# Patient Record
Sex: Female | Born: 1970 | Race: White | Hispanic: No | Marital: Married | Smoking: Never smoker
Health system: Southern US, Community
[De-identification: ages and names within clinical notes are randomized; demographics above are authoritative.]

## PROBLEM LIST (undated history)

## (undated) DIAGNOSIS — I10 Essential (primary) hypertension: Secondary | ICD-10-CM

## (undated) DIAGNOSIS — E785 Hyperlipidemia, unspecified: Secondary | ICD-10-CM

## (undated) DIAGNOSIS — K219 Gastro-esophageal reflux disease without esophagitis: Secondary | ICD-10-CM

## (undated) DIAGNOSIS — J4520 Mild intermittent asthma, uncomplicated: Secondary | ICD-10-CM

## (undated) DIAGNOSIS — R079 Chest pain, unspecified: Secondary | ICD-10-CM

## (undated) DIAGNOSIS — R911 Solitary pulmonary nodule: Secondary | ICD-10-CM

## (undated) HISTORY — DX: Essential (primary) hypertension: I10

## (undated) HISTORY — DX: Hyperlipidemia, unspecified: E78.5

## (undated) HISTORY — DX: Chest pain, unspecified: R07.9

## (undated) HISTORY — DX: Solitary pulmonary nodule: R91.1

## (undated) HISTORY — DX: Gastro-esophageal reflux disease without esophagitis: K21.9

## (undated) HISTORY — PX: OTHER SURGICAL HISTORY: SHX169

## (undated) HISTORY — DX: Mild intermittent asthma, uncomplicated: J45.20

---

## 1998-09-15 HISTORY — PX: TONSILLECTOMY AND ADENOIDECTOMY: SUR1326

## 1998-09-15 HISTORY — PX: TUBAL LIGATION: SHX77

## 2003-09-16 HISTORY — PX: OTHER SURGICAL HISTORY: SHX169

## 2003-10-26 ENCOUNTER — Ambulatory Visit (HOSPITAL_COMMUNITY): Admission: RE | Admit: 2003-10-26 | Discharge: 2003-10-26 | Payer: Self-pay | Admitting: Internal Medicine

## 2004-04-12 ENCOUNTER — Encounter: Admission: RE | Admit: 2004-04-12 | Discharge: 2004-04-12 | Payer: Self-pay | Admitting: Internal Medicine

## 2008-01-05 ENCOUNTER — Encounter: Payer: Self-pay | Admitting: Cardiology

## 2008-02-22 ENCOUNTER — Encounter: Payer: Self-pay | Admitting: Cardiology

## 2008-02-23 ENCOUNTER — Encounter: Payer: Self-pay | Admitting: Cardiology

## 2008-04-20 ENCOUNTER — Encounter: Payer: Self-pay | Admitting: Cardiology

## 2008-05-17 ENCOUNTER — Encounter: Payer: Self-pay | Admitting: Cardiology

## 2008-07-03 ENCOUNTER — Ambulatory Visit: Payer: Self-pay | Admitting: Cardiology

## 2008-07-07 ENCOUNTER — Ambulatory Visit: Payer: Self-pay | Admitting: Cardiology

## 2008-08-29 DIAGNOSIS — R079 Chest pain, unspecified: Secondary | ICD-10-CM | POA: Insufficient documentation

## 2008-08-31 ENCOUNTER — Ambulatory Visit: Payer: Self-pay | Admitting: Cardiology

## 2009-05-09 ENCOUNTER — Encounter (INDEPENDENT_AMBULATORY_CARE_PROVIDER_SITE_OTHER): Payer: Self-pay | Admitting: *Deleted

## 2010-01-19 ENCOUNTER — Encounter: Payer: Self-pay | Admitting: Pulmonary Disease

## 2010-02-05 ENCOUNTER — Ambulatory Visit: Payer: Self-pay | Admitting: Pulmonary Disease

## 2010-02-05 DIAGNOSIS — K219 Gastro-esophageal reflux disease without esophagitis: Secondary | ICD-10-CM

## 2010-02-05 DIAGNOSIS — E785 Hyperlipidemia, unspecified: Secondary | ICD-10-CM

## 2010-02-05 DIAGNOSIS — J984 Other disorders of lung: Secondary | ICD-10-CM

## 2010-02-05 DIAGNOSIS — J45909 Unspecified asthma, uncomplicated: Secondary | ICD-10-CM | POA: Insufficient documentation

## 2010-02-06 ENCOUNTER — Ambulatory Visit: Payer: Self-pay | Admitting: Internal Medicine

## 2010-02-07 ENCOUNTER — Telehealth: Payer: Self-pay | Admitting: Pulmonary Disease

## 2010-08-12 ENCOUNTER — Ambulatory Visit: Payer: Self-pay | Admitting: Cardiology

## 2010-08-12 ENCOUNTER — Ambulatory Visit: Payer: Self-pay | Admitting: Pulmonary Disease

## 2010-10-15 NOTE — Progress Notes (Signed)
Summary: results  Phone Note Call from Patient Call back at [phone]   Caller: Patient Call For: Taegan Haider Summary of Call: calling for ct scan results Initial call taken by: Rickard Patience,  Feb 07, 2010 8:19 AM  Follow-up for Phone Call        please advise of CT results. Thanks. Carron Curie CMA  Feb 07, 2010 9:03 AM   Pt anxious re: CT results.  Can you let her know something today.? Follow-up by: Eugene Gavia,  Feb 08, 2010 9:20 AM  Additional Follow-up for Phone Call Additional follow up Details #1::        Dr. Craige Cotta please advise. Thanks. Zackery Barefoot CMA  Feb 08, 2010 9:24 AM   [facility] advising pt will cal as soon as VS reviews same. Zackery Barefoot CMA  Feb 08, 2010 9:26 AM
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
## 2010-10-15 NOTE — Assessment & Plan Note (Signed)
Summary: 6 MONTH F/U PULMONARY NODULE/[facility]   Visit Type:  Follow-up Copy to:  Dr. Ruthy Dick Primary Mario Coronado/Referring Clayton Bosserman:  Dr. Ruthy Dick  CC:  Follow-up CT chest for pulmonary nodule...no complaints today.  History of Present Illness: 40 yo female with pulmonary nodule, and mild/intermittent asthma.  She denies any specific complaints.  CT of Chest  Procedure date:  08/12/2010  Findings:      CT CHEST WITHOUT CONTRAST   Technique:  Multidetector CT imaging of the chest was performed following the standard protocol without IV contrast.   Comparison: 02/06/2010   Findings: No pathologically enlarged mediastinal or axillary lymph nodes.  Hilar regions are difficult to definitively evaluate without IV contrast.  Heart size normal.  No pericardial effusion. Scattered pulmonary nodules measure up to 5 mm in size in the right lower lobe, stable.  No pleural fluid.  Airway is unremarkable.   Incidental imaging of the upper abdomen shows a 9 mm low attenuation lesion in the liver, stable.  No worrisome  lytic or sclerotic lesions.   IMPRESSION: Scattered pulmonary nodules measure 5 mm or less in size and are stable in a 104-month interval from 02/06/2010.  If this patient is at risk for bronchogenic carcinoma, follow-up could be performed in 12 months to ensure continued stability.   Current Medications (verified): 1)  Protonix 40 Mg Tbec (Pantoprazole Sodium) .... Take One Tablet By Mouth Every Morning 2)  Simvastatin 40 Mg Tabs (Simvastatin) .Marland Kitchen.. 1 By Mouth Daily  Allergies (verified): No Known Drug Allergies  Past History:  Past Medical History: GERD Hyperlipidemia Mild, intermittent Asthma Allergic rhinitis Pulmonary nodule      - CT chest 05/11, 11/11  Past Surgical History: Reviewed history from 02/05/2010 and no changes required. T and A in 2000 C-sections in 1998 and 2000 Tubal ligation in 2000 Hysterectomy in 2005  Review of  Systems  The patient denies shortness of breath with activity, shortness of breath at rest, productive cough, non-productive cough, coughing up blood, chest pain, acid heartburn, weight change, abdominal pain, difficulty swallowing, sore throat, headaches, nasal congestion/difficulty breathing through nose, hand/feet swelling, joint stiffness or pain, rash, change in color of mucus, and fever.    Vital Signs:  Patient profile:   40 year old female Height:      64 inches (162.56 cm) Weight:      180 pounds (81.82 kg) BMI:     31.01 O2 Sat:      97 % on Room air Temp:     97.9 degrees F (36.61 degrees C) oral Pulse rate:   79 / minute BP sitting:   134 / 80  (left arm) Cuff size:   regular  Vitals Entered By: Michel Bickers CMA (August 12, 2010 3:49 PM)  O2 Sat at Rest %:  97 O2 Flow:  Room air CC: Follow-up CT chest for pulmonary nodule...no complaints today Comments Medications reviewed with patient Michel Bickers [facility]  August 12, 2010 3:55 PM   Physical Exam  General:  normal appearance, healthy appearing, and obese.   Nose:  no deformity, discharge, inflammation, or lesions Mouth:  no deformity or lesions Neck:  no masses, thyromegaly, or abnormal cervical nodes Lungs:  clear bilaterally to auscultation and percussion Heart:  regular rate and rhythm, S1, S2 without murmurs, rubs, gallops, or clicks Extremities:  no clubbing, cyanosis, edema, or deformity noted Neurologic:  normal CN II-XII and strength normal.   Cervical Nodes:  no significant adenopathy Psych:  alert and cooperative;  normal mood and affect; normal attention span and concentration   Impression & Recommendations:  Problem # 1:  PULMONARY NODULE (ICD-518.89) This is stable on current CT chest.  Will arrange for repeat CT chest w/o contrast in one year.  Problem # 2:  ASTHMA (ICD-493.90)  She has mild, intermittent asthma.  This is stable.  Complete Medication List: 1)  Protonix 40 Mg Tbec (Pantoprazole  sodium) .... Take one tablet by mouth every morning 2)  Simvastatin 40 Mg Tabs (Simvastatin) .Marland Kitchen.. 1 by mouth daily  Other Orders: Est. Patient Level III (95621) Radiology Referral (Radiology)  Patient Instructions: 1)  Will schedule CT chest in one year 2)  Follow up after CT chest done   Immunization History:  Influenza Immunization History:    Influenza:  historical (06/15/2010)

## 2011-01-28 NOTE — Assessment & Plan Note (Signed)
[facility]                          [facility] OFFICE NOTE   [name], [name]                      MRN:          [id number]  DATE:08/31/2008                            DOB:          1970-10-21    PRIMARY CARE PHYSICIAN:  Dr. Ruthy Dick   REASON FOR VISIT:  Followup cardiac testing.   HISTORY OF PRESENT ILLNESS:  I saw [name] back in October.  She had  been experiencing fairly sporadic atypical chest pain, although in the  setting of a family history of cardiovascular disease as well as  recently diagnosed hyperlipidemia.  I referred her for basic cardiac  risk stratification and she underwent an exercise echocardiogram, which  was very reassuring.  She exercised into stage III without chest pain  and had no diagnostic electrocardiographic changes.  There were no  inducible wall motion abnormalities by echocardiography to suggest  ischemia and her ejection fraction was approximately 60%.  I reviewed  this with her today.  At this point, we would recommend continued  efforts at risk factor modification, diet, and exercise.   ALLERGIES:  No known drug allergies.   PRESENT MEDICATIONS:  1. Protonix 40 mg p.o. q.a.m.  2. Simvastatin 20 mg p.o. nightly.   REVIEW OF SYSTEMS:  As described in the history of present illness.  Otherwise negative.   PHYSICAL EXAMINATION:  Blood pressure today was 151/97, heart rate is  88, weight is 179 pounds.  Otherwise, no significant change and baseline  exam.   IMPRESSION AND RECOMMENDATIONS:  Reassuring cardiac assessment with  normal exercise echocardiogram in the setting of hyperlipidemia and a  family history of cardiovascular disease.  Would recommend continued  efforts at lipid management aiming for LDL control under 100, and also  close attention of blood pressure.  Regular exercise regimen would be of  benefit.  We can see her back as needed.     Jonelle Sidle, MD  Electronically Signed    SGM/MedQ  DD: 08/31/2008  DT: 08/31/2008  Job #: [id number]   cc:   Ruthy Dick

## 2011-01-28 NOTE — Assessment & Plan Note (Signed)
[facility]                          [facility]ICE NOTE   [name], [name]                      MRN:          [id number]  DATE:07/03/2008                            DOB:          1971-08-29    REFERRING PHYSICIAN:  Dr. Ruthy Dick.   REASON FOR VISIT:  Chest pain and family history of cardiovascular  disease.   HISTORY OF PRESENT ILLNESS:  Ms. 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  She states that her mother died at age 54 with a  sudden heart attack.  She reports being in her usual state of health  until sometime back in July, she has been experiencing sporadic episodes  of chest discomfort described as a squeezing and without any clear  precipitant that she can identify.  She seems to sometimes indicate that  this is worse with emotional stress, although not necessarily always.  She does have reflux disease and also reports problems with seasonal  asthma somewhat complicating the picture.  She has not undergone any  prior cardiac risk stratification.  Today's electrocardiogram is normal  showing sinus rhythm at 74 beats per minute with normal intervals.  She  does mention that her symptoms are not exertional in nature.   ALLERGIES:  No known drug allergies.   MEDICATIONS:  1. Protonix 40 mg p.o. q.a.m.  2. Simvastatin 20 mg p.o. nightly.   REVIEW OF SYSTEMS:  As outlined above.  She has seasonal allergies,  usually worse in the winter months.  No cough, hemoptysis, fevers,  chills, melena, hematochezia.  No prolonged palpitations.  Does have a  history of anemia.  Otherwise negative.   FAMILY HISTORY:  The patient's father died at age 28 with colon cancer  and her mother died at age 32 with a myocardial infarction.  Also listed  history of lymphoma.  She has a  brother who is alive and well.   SOCIAL HISTORY:  The patient is married.  She has 2 children.  She does  not exercise regularly.  She denies any history of tobacco or alcohol  use.   PHYSICAL EXAMINATION:  VITAL SIGNS:  Blood pressure 138/93, heart rate  96, weight is 183 pounds.  GENERAL:  She is an overweight woman in no acute distress.  HEENT:  Conjunctiva is normal.  Oropharynx is clear.  NECK:  Supple.  No elevated jugular venous pressure.  No loud bruits.  No thyromegaly is noted.  No thyroid tenderness.  LUNGS:  Clear without labored breathing.  CARDIAC:  Regular rate and rhythm.  No significant S4 or pathologic  murmur.  ABDOMEN:  Generally soft, nontender.  Bowel sounds present.  EXTREMITIES:  No frank pitting edema.  Distal pulses are 2+.  SKIN:  Warm and dry.  MUSCULOSKELETAL:  No kyphosis noted.  NEUROPSYCHIATRIC:  The patient is alert and oriented x3.  Affect is  appropriate.   IMPRESSION AND RECOMMENDATIONS:  Intermittent, somewhat atypical in  pattern, chest pain  syndrome in a 40 year old woman with a family  history of premature cardiovascular disease and recently diagnosed  dyslipidemia.  Her resting electrocardiogram is normal.  She has not  undergone any prior cardiac risk stratification and we will arrange an  exercise echocardiogram for further evaluation.  I will have her come  back in the office to discuss the results.  No other specific medication  changes were made today.     Jonelle Sidle, MD  Electronically Signed    SGM/MedQ  DD: 07/03/2008  DT: 07/04/2008  Job #: [id number]   cc:   Ruthy Dick

## 2011-01-31 NOTE — Op Note (Signed)
NAME:  [name], [name]                         ACCOUNT NO.:  [id number]   MEDICAL RECORD NO.:  [id number]                   PATIENT TYPE:  AMB   LOCATION:  DAY                                  FACILITY:  [facility]   PHYSICIAN:  R. Roetta Sessions, M.D.              DATE OF BIRTH:  07/07/1971   DATE OF PROCEDURE:  10/26/2003  DATE OF DISCHARGE:                                 OPERATIVE REPORT   PROCEDURE:  EGD followed by high-risk screening colonoscopy.   INDICATIONS FOR PROCEDURE:  The patient is a 40 year old lady with nausea  and fairly typical reflux symptoms over the past few months, now refractory  to even Nexium 40 mg orally daily.  She had positive H pylori serologies  recently and did undergo treatment with triple drug therapy.  Gallbladder  ultrasound negative.  HIDA scan demonstrated a gallbladder EF of 43% without  reproduction of the patient's symptoms (the patient did have some possible  sludge in the gallbladder; otherwise, ultrasound appeared entirely normal).  She was recently found to be Hemoccult-negative x3.  CBC was normal.  Sed  rate 8.  Comprehensive metabolic profile was okay.  EGD is now being  performed to further evaluate her upper GI tract symptoms.  She has a  positive family history of colorectal cancer in her father who succumbed to  the illness at age 52.  Colonoscopy is now being down for high-risk  screening purposes.  This approach has been discussed with the patient at  length.  The potential risks, benefits, and alternatives have been reviewed.  Please see my 09/25/2003 consultation note.   PROCEDURE:  O2 saturation, blood pressure, pulses, and respirations were  monitored throughout the entirety of both procedures.  Conscious sedation  was with Versed 6 mg IV, Demerol 100 mg IV in divided doses for both  procedures.  Cetacaine spray for topical oropharyngeal anesthesia.  The  instrument used was the Olympus GIF-Q140 gastroscope and pediatric  colonoscope.   EGD FINDINGS:  Examination of the tubular esophagus revealed no mucosal  abnormalities.  The EG junction was easily traversed.   Stomach:  The gastric cavity was empty and insufflated well with air.  Thorough examination of the gastric mucosa including retroflex view in the  proximal stomach and esophagogastric junction demonstrated no abnormalities.  The pylorus was patent and easily traversed.   Duodenum:  Examination of the bulb and second portion revealed a couple of  superficial bulbar erosions.  Otherwise D1 and D2 appeared normal.   THERAPEUTIC/DIAGNOSTIC MANEUVERS PERFORMED:  None.   The patient tolerated the procedure well and was prepared for colonoscopy.   COLONOSCOPY FINDINGS:  Digital rectal examination revealed no abnormalities.   ENDOSCOPIC FINDINGS:  The prep was adequate.   Rectum:  Examination of the rectal mucosa including retroflex view of the  anal verge revealed a couple of anal papilla and internal hemorrhoids.  Otherwise normal rectal mucosa.  Colon:  The colonic mucosa was surveyed from the rectosigmoid junction  through the left, transverse, right colon to the area of the appendiceal  orifice, ileocecal valve, and cecum.  These structures were well-seen and  photographed for the record.  From this level, the scope was slowly  withdrawn.  All previously mentioned mucosal surfaces were once again  surveyed.  The colonic mucosa appeared normal.  The patient tolerated both  of the procedures well and was reactive in endoscopy.   EGD IMPRESSION:  1. Normal esophagus.  2. Normal stomach.  3. A couple of superficial bulbar erosions.  Otherwise normal first and     second portions of the duodenum.   COLONOSCOPY IMPRESSION:  1. Internal hemorrhoids.  2. Anal papilla.  Otherwise normal rectum.  3. Normal colon.   RECOMMENDATIONS:  1. Increase Nexium to 40 mg orally before breakfast and supper daily.  2. Antireflux measures emphasized.  3.  Followup appointment with [place] in one month to see how she is doing.  If     she is not markedly improved, she will need further investigation.  4. Repeat high-risk screening colonoscopy in five years.      ___________________________________________                                            Jonathon Bellows, M.D.   RMR/MEDQ  D:  10/26/2003  T:  10/26/2003  Job:  [id number]   cc:   Ruthy Dick  [address]  [place]  [place] [address]  Fax: [phone]

## 2011-01-31 NOTE — Consult Note (Signed)
NAME:  [name], [name]                           ACCOUNT NO.:  [id number]   MEDICAL RECORD NO.:  [id number]                  PATIENT TYPE:   LOCATION:                                       FACILITY:   PHYSICIAN:  R. Roetta Sessions, M.D.              DATE OF BIRTH:  [birth year]   DATE OF CONSULTATION:  09/25/2003  DATE OF DISCHARGE:                                   CONSULTATION   GI CONSULT   REQUESTING PHYSICIAN:  Dr. Donata Duff   CHIEF COMPLAINT:  Persistent nausea for over 2 months, epigastric pain,  positive H. pylori.   HISTORY OF PRESENT ILLNESS:  This patient is a 40 year old Caucasian female  who presents today for evaluation of persistent nausea for more than 2  months.  This is also associated with some intermittent epigastric pain as  well as periumbilical pain for approximately the last 6 months.  She states  that the pain is somewhat persistent; however, it is episodic.  She does  have a history of GERD for many years; and has been on PPI for the last 6  months.  On September 14, 2003 she was positive for serum H. pylori,  therefore, she started Biaxin b.i.d., amoxicillin b.i.d. and Nexium daily  approximately a week ago.  She also underwent gallbladder ultrasound as well  as a HIDA scan in November or December of 2004 which did reveal some  gallbladder sludge, but was otherwise normal (we will request these  reports).  She denies any vomiting. She did take an at home pregnancy test,  although she does have a history of tubal ligation.  Pregnancy test was  negative.  Bowel movements have been normal every day or every other day,  soft and brown, without any melena or hematochezia.  She denies any diarrhea  or constipation; dysphagia or odynophagia.  She did undergo EGD for  refractory reflux symptoms on November 13, 1994.  At that point in time, she  was found to have distal esophageal erosions consistent with erosive reflux  esophagitis and a somewhat patulous EG  junction of uncertain significance.   PAST MEDICAL HISTORY:  Exercise induced asthma.   PAST SURGICAL HISTORY:  C-section x2, tubal ligation, tonsillectomy and  adenoidectomy.   CURRENT MEDICATIONS:  1. Biaxin b.i.d.  2. Amoxicillin b.i.d.  3. Nexium 40 mg daily.   ALLERGIES:  No known drug allergies.   FAMILY HISTORY:  She does have positive family history for a father with  colon cancer, diagnosed at age 46 and deceased at age 32.  Mother is also  deceased, at age 21, with lymphoma.  There is also a family history of  hypertension, coronary artery disease, and diabetes mellitus.  She has one  brother in good health.   SOCIAL HISTORY:  Ms. Oravec has been married for 12 years.  She has 2  children a 47-year-old son and 72-year-old daughter who are  healthy.  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  She denies any tobacco use.  She reports occasional once or twice  per year alcohol use, she denies any drug use.   REVIEW OF SYSTEMS:  CONSTITUTIONAL:  Weight is stable.  Appetite is  decreased with exacerbation of the above symptoms.  She is complaining of  occasional fatigue.  She denies any fever or chills.  HEME:  She does have  history of anemia; however, she frequently donates blood.  Last blood  inanition was over a year ago.  GYN:  Last menstrual period August 23, 2003.  Cycles are usually 28-35 days and normal flow.  GI:  See HPI.  SKIN:  She denies any rash or jaundice.  CARDIOVASCULAR:  She denies any chest pain  or palpitations.  PULMONARY:  She denies any shortness of breath or dyspnea.   PHYSICAL EXAMINATION:  VITAL SIGNS:  Weight 175 pounds.  Height 64 inches.  Temperature 98.3, blood pressure 110/80, pulse 74.  GENERAL:  This patient is a 40 year old Caucasian female who is alert,  oriented, pleasant and cooperative.  HEENT:  Sclerae are clear, nonicteric. Conjunctivae pink. Oropharynx pink  and moist without  any lesions.  NECK:  Supple without any masses or thyromegaly.  HEART:  Regular rate and rhythm with normal S1-S2 without any murmurs,  clicks, rubs or gallops.  LUNGS:  Clear to auscultation bilaterally.  ABDOMEN:  Positive bowel sounds x4.  Soft, nontender, nondistended with no  palpable mass or hepatosplenomegaly.  EXTREMITIES:  2+ pedal pulses bilaterally.  No edema.  SKIN:  Pink, warm and dry, without any rash or jaundice.   ASSESSMENT:  This patient is a 40 year old Caucasian female with persistent  nausea of greater than 2 months duration as well as some epigastric and  periumbilical episodic abdominal pain.  Initially it appears that biliary  colic has been ruled out.  We will request these records for our review.  I  have advised the patient to complete Prevpac therapy and continue daily PPI  after that.  Pending review of workup which has already been obtained, if  this does not appear to be biliary colic origin, would recommend further  evaluation with another EGD by Dr. Jena Gauss.   RECOMMENDATIONS:  1. I have discussed EGD with Ms. Gille which include risks and benefits     which include but are not limited to bleeding, perforation, and     infection.  She agrees with this plan.  Consent will be obtained.  2. We will request records of abdominal ultrasound and HIDA scan.  3. Will obtain Hemoccult cards x3.  If any are positive will proceed with     colonoscopy as well.  4. Labs today to include CMP, CBC, and sedimentation rate.  5. Recommend colonoscopy in June 2005 given positive family history.   We would like to thank Dr. Mora Appl for allowing [place] to participate in the care  of this patient.     ________________________________________  ___________________________________________  Nicholas Lose, N.P.                  Jonathon Bellows, M.D.   KC/MEDQ  D:  09/26/2003  T:  09/26/2003  Job:  [id number]  cc:   Donata Duff  [address]  [place]  [place] [address]   Fax: [phone]   R. Roetta Sessions, M.D.  [address]  [place]  [place] [address]  Fax: [phone]

## 2011-03-14 ENCOUNTER — Encounter: Payer: Self-pay | Admitting: Cardiology

## 2016-09-16 ENCOUNTER — Encounter: Payer: Self-pay | Admitting: *Deleted

## 2016-09-17 ENCOUNTER — Encounter: Payer: Self-pay | Admitting: *Deleted

## 2016-09-17 ENCOUNTER — Ambulatory Visit (INDEPENDENT_AMBULATORY_CARE_PROVIDER_SITE_OTHER): Payer: PRIVATE HEALTH INSURANCE | Admitting: Cardiovascular Disease

## 2016-09-17 ENCOUNTER — Encounter: Payer: Self-pay | Admitting: Cardiovascular Disease

## 2016-09-17 VITALS — BP 132/90 | HR 64 | Ht 64.0 in | Wt 166.0 lb

## 2016-09-17 DIAGNOSIS — R079 Chest pain, unspecified: Secondary | ICD-10-CM | POA: Diagnosis not present

## 2016-09-17 DIAGNOSIS — I10 Essential (primary) hypertension: Secondary | ICD-10-CM

## 2016-09-17 DIAGNOSIS — E7800 Pure hypercholesterolemia, unspecified: Secondary | ICD-10-CM | POA: Diagnosis not present

## 2016-09-17 DIAGNOSIS — R5383 Other fatigue: Secondary | ICD-10-CM

## 2016-09-17 DIAGNOSIS — Z9289 Personal history of other medical treatment: Secondary | ICD-10-CM

## 2016-09-17 NOTE — Patient Instructions (Addendum)
Medication Instructions:  Continue all current medications.  Labwork: none  Testing/Procedures: Your physician has requested that you have an exercise stress myoview. For further information please visit [email or web address]. Please follow instruction sheet, as given.  Office will contact with results via phone or letter.    Follow-Up: 6 weeks   Any Other Special Instructions Will Be Listed Below (If Applicable).   If you need a refill on your cardiac medications before your next appointment, please call your pharmacy.  

## 2016-09-17 NOTE — Progress Notes (Signed)
CARDIOLOGY CONSULT NOTE  Patient ID: [name] MRN: [id number] DOB/AGE: Jan 23, 1971 46 y.o.  Admit date: (Not on file) Primary Physician: Ignatius Speckinghruv B Vyas, MD Referring Physician:   Reason for Consultation: chest pain  HPI: The patient is a 46 year old woman with a history of hypertension and hyperlipidemia referred for evaluation of chest pain. [facility] late [facility] in November and December. Troponins were normal. ECG was normal. Echocardiogram was normal. Potassium was low at 3.2. She had been on hydrochlorothiazide which has since been discontinued by her PCP.  She has been experiencing intermittent upper left-sided chest pains primarily occurring at rest. She denies exertional chest pain and dyspnea. She has chronic bilateral ankle swelling. She's had some mild associated dizziness but denies syncope. She has felt extremely fatigued and wants to sleep immediately after coming home from work. She has had episodic diaphoresis associated with this. She does not smoke. She describes the pains as a "squeezing sensation". It has radiated down her left arm.  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  Family history: Mother had a heart attack at age 48.     No Known Allergies  Current Outpatient Prescriptions  Medication Sig Dispense Refill  . pantoprazole (PROTONIX) 40 MG tablet Take 40 mg by mouth every morning.      . simvastatin (ZOCOR) 40 MG tablet Take 40 mg by mouth daily.       No current facility-administered medications for this visit.     Past Medical History:  Diagnosis Date  . Allergic rhinitis   . Chest pain   . GERD (gastroesophageal reflux disease)   . HLD (hyperlipidemia)   . Hypertension   . Mild intermittent asthma   . Pulmonary nodule    CT chest 5/11, 11/11    Past Surgical History:  Procedure Laterality Date  . c-sections  1998, 2000  . hysterectomy (other)  2005  . TONSILLECTOMY AND  ADENOIDECTOMY  2000  . TUBAL LIGATION  2000    Social History   Social History  . Marital status: Single    Spouse name: N/A  . Number of children: N/A  . Years of education: N/A   Occupational History  . Not on file.   Social History Main Topics  . Smoking status: Never Smoker  . Smokeless tobacco: Never Used  . Alcohol use No  . Drug use: No  . Sexual activity: Not on file   Other Topics Concern  . Not on file   Social History Narrative   Full time- phlebotomist. Married.        Prior to Admission medications   Medication Sig Start Date End Date Taking? Authorizing Provider  hydrochlorothiazide (HYDRODIURIL) 25 MG tablet Take 25 mg by mouth daily.    Historical Provider, MD  pantoprazole (PROTONIX) 40 MG tablet Take 40 mg by mouth every morning.      Historical Provider, MD  simvastatin (ZOCOR) 40 MG tablet Take 40 mg by mouth daily.      Historical Provider, MD     Review of systems complete and found to be negative unless listed above in HPI     Physical exam Blood pressure 132/90, pulse 64, height 5\' 4"  (1.626 m), weight 166 lb (75.3 kg), SpO2 99 %. General: NAD Neck: No JVD, no thyromegaly or thyroid nodule.  Lungs: Clear to auscultation bilaterally with normal respiratory effort. CV: Nondisplaced PMI. Regular rate and rhythm,  normal S1/S2, no S3/S4, no murmur.  Mild non-pitting periankle edema b/l.  No carotid bruit.    Abdomen: Soft, nontender, no hepatosplenomegaly, no distention.  Skin: Intact without lesions or rashes.  Neurologic: Alert and oriented x 3.  Psych: Normal affect. Extremities: No clubbing or cyanosis.  HEENT: Normal.   ECG: Most recent ECG reviewed.  Labs:  No results found for: WBC, HGB, HCT, MCV, PLT No results for input(s): NA, K, CL, CO2, BUN, CREATININE, CALCIUM, PROT, BILITOT, ALKPHOS, ALT, AST, GLUCOSE in the last 168 hours.  Invalid input(s): LABALBU No results found for: CKTOTAL, CKMB, CKMBINDEX, TROPONINI No results  found for: CHOL No results found for: HDL No results found for: LDLCALC No results found for: TRIG No results found for: CHOLHDL No results found for: LDLDIRECT       Studies: No results found.  ASSESSMENT AND PLAN:  1. Chest pain and fatigue: Typical and atypical symptoms. Risk factors include hypertension, hyperlipidemia, and family history of premature coronary artery disease in her mother. I will proceed with a nuclear myocardial perfusion imaging study (exercise Myoview) to evaluate for ischemic heart disease.  2. Hypertension: Borderline elevated DBP. Will monitor.  3. Hyperlipidemia: Continue simvastatin.  Dispo: fu 6 wks   Signed: Prentice Docker, M.D., F.A.C.C.  09/17/2016, 10:42 AM

## 2016-09-23 ENCOUNTER — Encounter (HOSPITAL_COMMUNITY): Admission: RE | Admit: 2016-09-23 | Payer: PRIVATE HEALTH INSURANCE | Source: Ambulatory Visit

## 2016-09-23 ENCOUNTER — Encounter (HOSPITAL_COMMUNITY)
Admission: RE | Admit: 2016-09-23 | Discharge: 2016-09-23 | Disposition: A | Discharge status: Home / Self Care | Payer: PRIVATE HEALTH INSURANCE | Source: Ambulatory Visit | Attending: Cardiovascular Disease | Admitting: Cardiovascular Disease

## 2016-09-23 ENCOUNTER — Encounter (HOSPITAL_COMMUNITY): Payer: Self-pay

## 2016-09-23 DIAGNOSIS — R079 Chest pain, unspecified: Secondary | ICD-10-CM

## 2016-09-23 LAB — NM MYOCAR MULTI W/SPECT W/WALL MOTION / EF
CHL CUP NUCLEAR SDS: 1
CSEPEW: 10.1 METS
CSEPPHR: 151 {beats}/min
Exercise duration (min): 9 min
Exercise duration (sec): 0 s
LV dias vol: 42 mL (ref 46–106)
LVSYSVOL: 9 mL
MPHR: 173 {beats}/min
NUC STRESS TID: 1.06
Percent HR: 87 %
RATE: 0.37
RPE: 13
Rest HR: 64 {beats}/min
SRS: 5
SSS: 6

## 2016-09-23 MED ORDER — REGADENOSON 0.4 MG/5ML IV SOLN
INTRAVENOUS | Status: AC
  Filled 2016-09-23: qty 5

## 2016-09-23 MED ORDER — TECHNETIUM TC 99M TETROFOSMIN IV KIT
30.0000 | PACK | Freq: Once | INTRAVENOUS | Status: AC | PRN
  Administered 2016-09-23: 30 via INTRAVENOUS

## 2016-09-23 MED ORDER — TECHNETIUM TC 99M TETROFOSMIN IV KIT
10.0000 | PACK | Freq: Once | INTRAVENOUS | Status: AC | PRN
  Administered 2016-09-23: 9.9 via INTRAVENOUS

## 2016-09-23 MED ORDER — SODIUM CHLORIDE 0.9% FLUSH
INTRAVENOUS | Status: AC
  Administered 2016-09-23: 10 mL via INTRAVENOUS
  Filled 2016-09-23: qty 10

## 2016-09-26 ENCOUNTER — Other Ambulatory Visit (HOSPITAL_COMMUNITY): Payer: PRIVATE HEALTH INSURANCE

## 2016-09-26 ENCOUNTER — Encounter (HOSPITAL_COMMUNITY): Payer: PRIVATE HEALTH INSURANCE

## 2016-10-08 ENCOUNTER — Telehealth: Payer: Self-pay | Admitting: Cardiovascular Disease

## 2016-10-08 NOTE — Telephone Encounter (Signed)
Would like to know if her follow up is really needed this coming Friday. Since test came back normal

## 2016-10-09 NOTE — Telephone Encounter (Signed)
She does not need to follow up.

## 2016-10-09 NOTE — Telephone Encounter (Signed)
Please advise 

## 2016-10-09 NOTE — Telephone Encounter (Signed)
Told patient she does not need follow up this Friday per Dr Junius ArgyleKoneswaran's answer

## 2016-10-10 ENCOUNTER — Ambulatory Visit: Payer: PRIVATE HEALTH INSURANCE | Admitting: Cardiovascular Disease

## 2019-06-21 ENCOUNTER — Other Ambulatory Visit: Payer: Self-pay

## 2019-06-21 DIAGNOSIS — Z20822 Contact with and (suspected) exposure to covid-19: Secondary | ICD-10-CM

## 2019-06-23 LAB — NOVEL CORONAVIRUS, NAA: SARS-CoV-2, NAA: NOT DETECTED

## 2021-05-09 NOTE — Congregational Nurse Program (Signed)
Conducted  BP health screening per request of health fair attendee per her stating concern while attending the [employer] Fair.  BP (ABN)  Advised to  self monitor BP and obtain BP monitors at home for self monitoring purposes and to be able to report to medical provider during any upcoming wellness visits  Pt was provided educational material and counseled on ways to improve and prevent hypertension and diabetes.

## 2021-11-27 ENCOUNTER — Other Ambulatory Visit: Payer: Self-pay

## 2021-11-27 ENCOUNTER — Other Ambulatory Visit: Payer: Self-pay | Admitting: Internal Medicine

## 2021-11-27 DIAGNOSIS — Z1231 Encounter for screening mammogram for malignant neoplasm of breast: Secondary | ICD-10-CM

## 2021-12-25 ENCOUNTER — Ambulatory Visit
Admission: RE | Admit: 2021-12-25 | Discharge: 2021-12-25 | Disposition: A | Discharge status: Home / Self Care | Payer: Commercial Managed Care - PPO | Source: Ambulatory Visit | Attending: Internal Medicine | Admitting: Internal Medicine

## 2021-12-25 DIAGNOSIS — Z1231 Encounter for screening mammogram for malignant neoplasm of breast: Secondary | ICD-10-CM

## 2022-05-22 IMAGING — MG MM DIGITAL SCREENING BILAT W/ TOMO AND CAD
8 series · 8 of 24 positions shown · non-contrast
Comparison: Previous exam(s).

CLINICAL DATA: Screening.

EXAM:
DIGITAL SCREENING BILATERAL MAMMOGRAM WITH TOMOSYNTHESIS AND CAD
TECHNIQUE: Bilateral screening digital craniocaudal and mediolateral oblique
mammograms were obtained. Bilateral screening digital breast
tomosynthesis was performed. The images were evaluated with
computer-aided detection.

[L CC synth-2D]
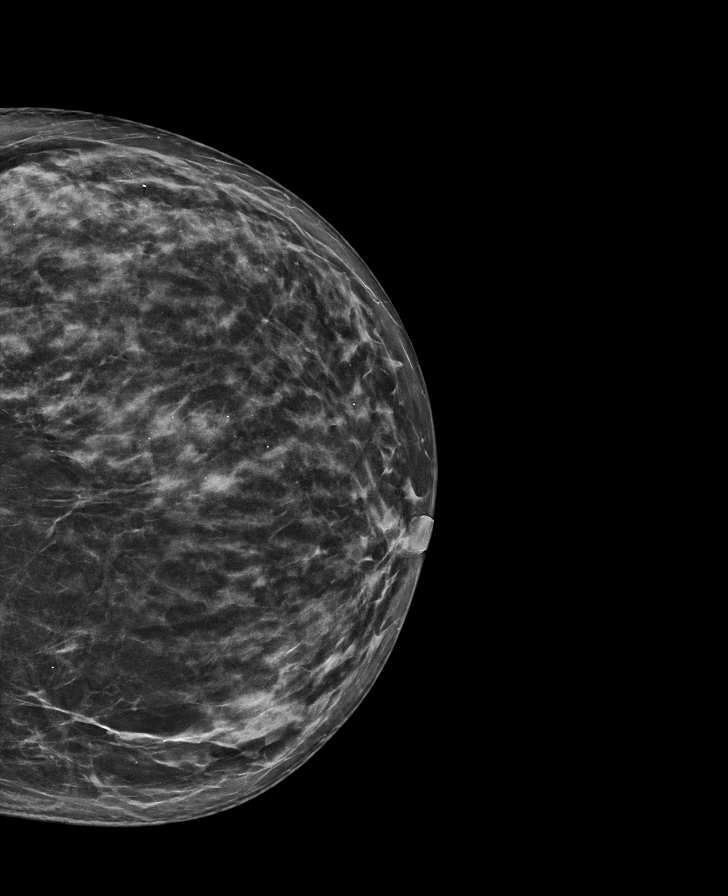

[R CC synth-2D]
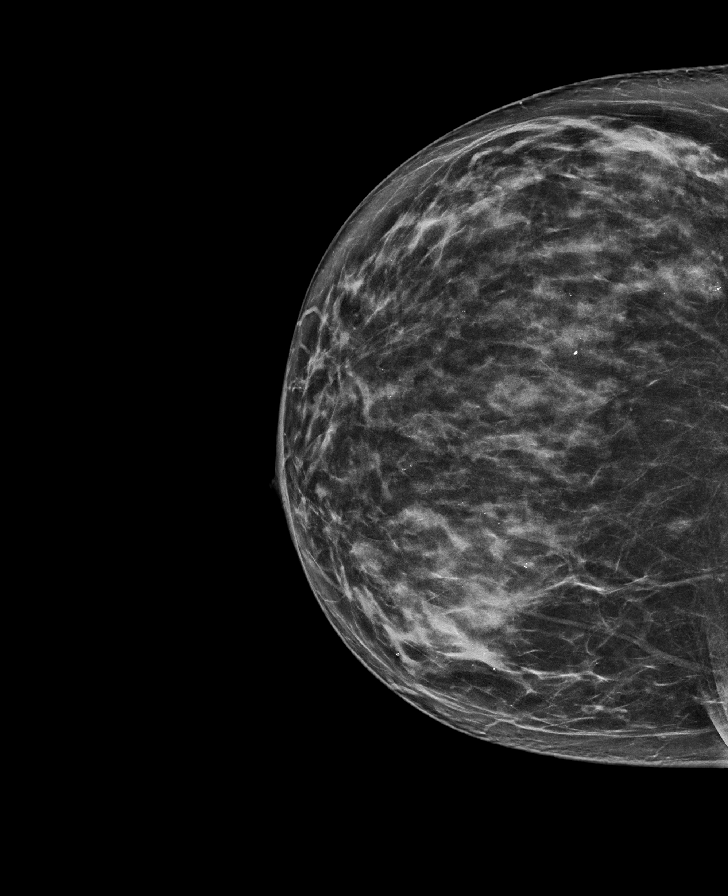

[R MLO synth-2D]
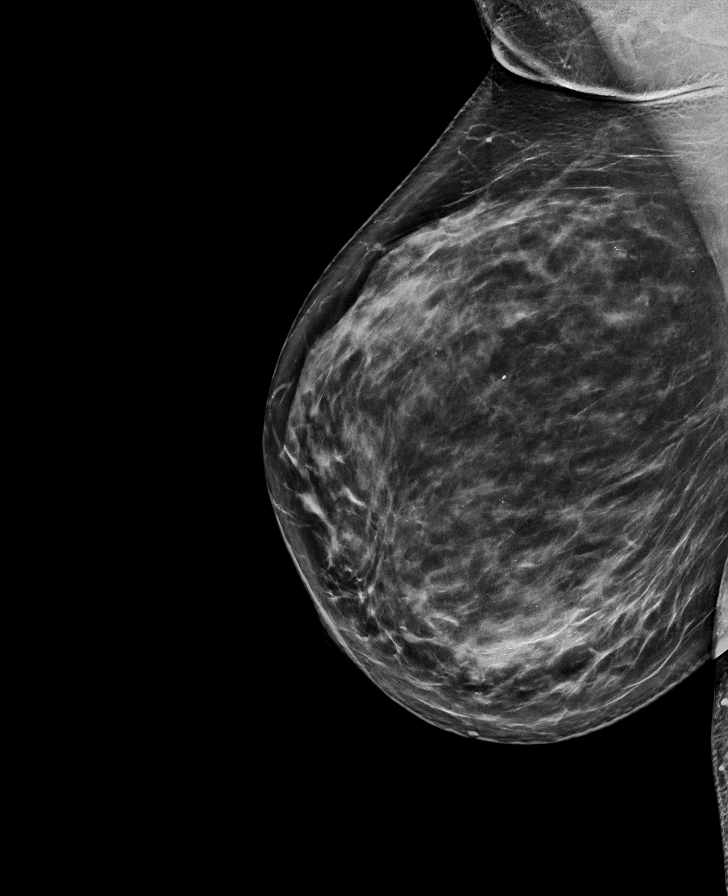

[L MLO synth-2D]
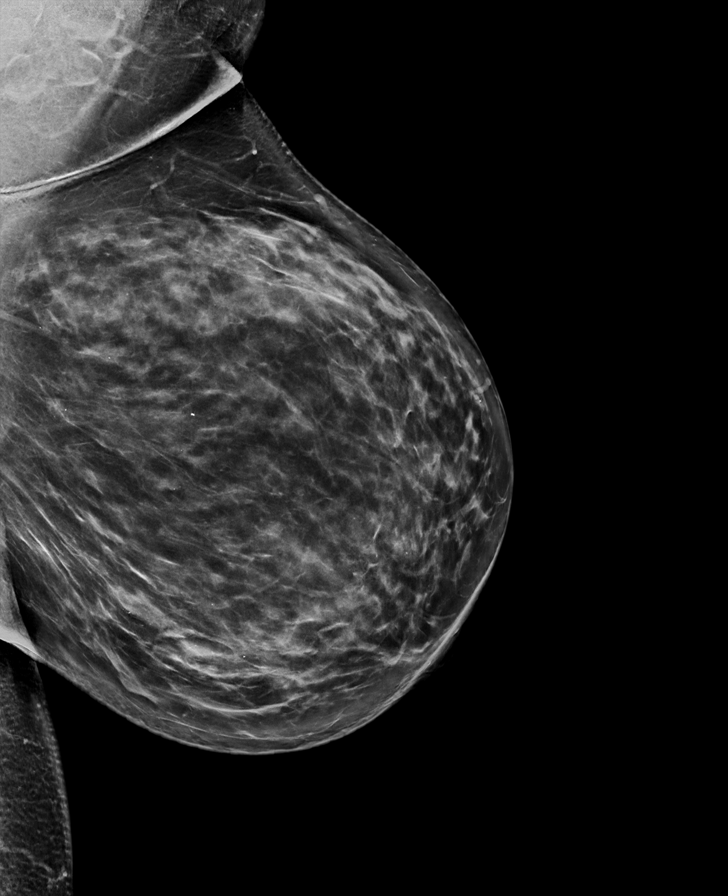

[R MLO tomo · tomo slice 47/93.0]
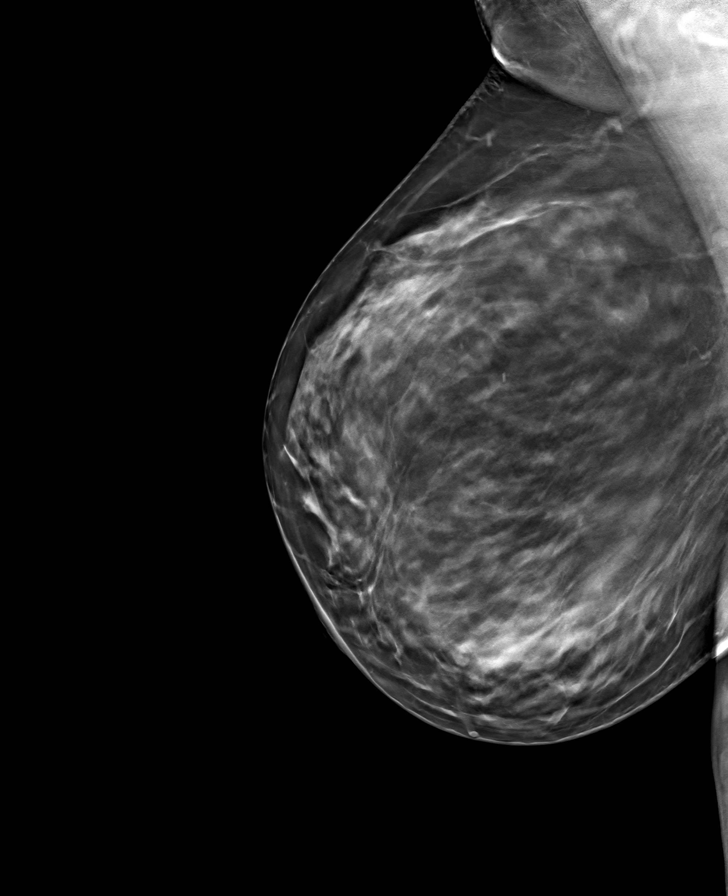

[L CC tomo · tomo slice 39/76.0]
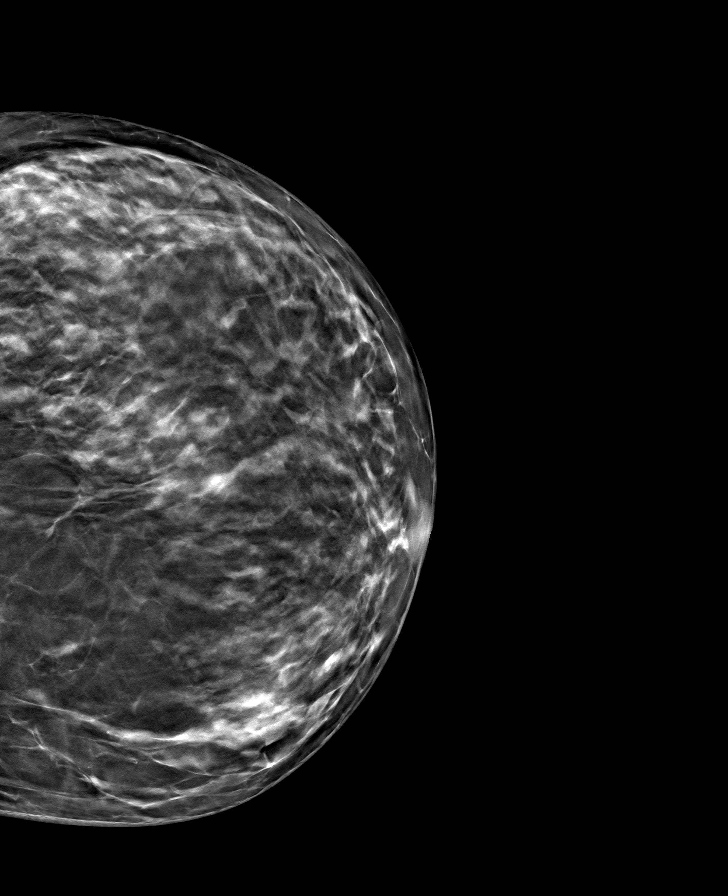

[L MLO tomo · tomo slice 49/96.0]
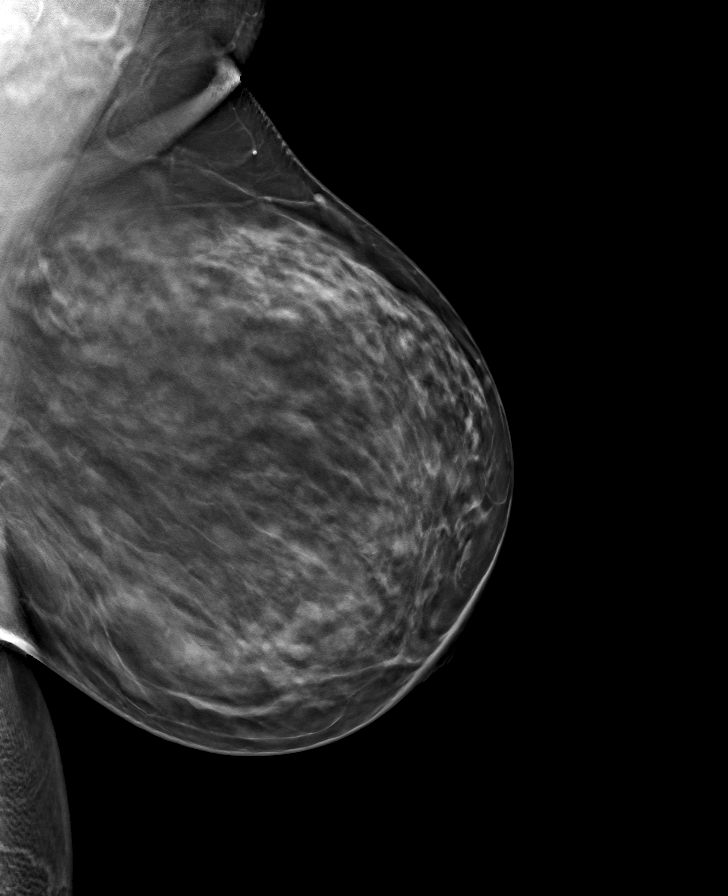

[R CC tomo · tomo slice 41/80.0]
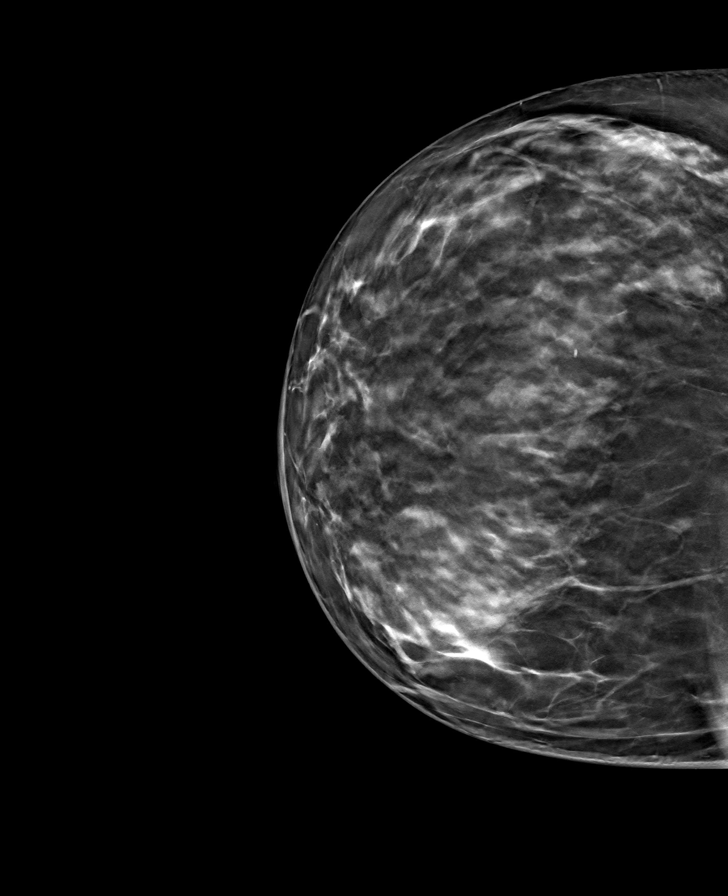

[8 of 24 positions shown; findings below may reference images not displayed]

ACR Breast Density Category d: The breast tissue is extremely dense,
which lowers the sensitivity of mammography
FINDINGS: There are no findings suspicious for malignancy.
IMPRESSION: No mammographic evidence of malignancy. A result letter of this
screening mammogram will be mailed directly to the patient.

RECOMMENDATION:
Screening mammogram in one year. (Code:TA-V-WV9)

BI-RADS CATEGORY  1: Negative.

## 2022-10-09 ENCOUNTER — Other Ambulatory Visit: Payer: Self-pay | Admitting: Orthopedic Surgery

## 2022-10-09 DIAGNOSIS — M542 Cervicalgia: Secondary | ICD-10-CM

## 2022-10-09 DIAGNOSIS — M5412 Radiculopathy, cervical region: Secondary | ICD-10-CM

## 2022-10-28 ENCOUNTER — Ambulatory Visit
Admission: RE | Admit: 2022-10-28 | Discharge: 2022-10-28 | Disposition: A | Discharge status: Home / Self Care | Payer: Commercial Managed Care - PPO | Source: Ambulatory Visit | Attending: Orthopedic Surgery | Admitting: Orthopedic Surgery

## 2022-10-28 DIAGNOSIS — M542 Cervicalgia: Secondary | ICD-10-CM

## 2022-10-28 DIAGNOSIS — M5412 Radiculopathy, cervical region: Secondary | ICD-10-CM

## 2023-02-06 ENCOUNTER — Other Ambulatory Visit: Payer: Self-pay | Admitting: Nurse Practitioner

## 2023-02-06 DIAGNOSIS — Z1231 Encounter for screening mammogram for malignant neoplasm of breast: Secondary | ICD-10-CM

## 2023-02-24 ENCOUNTER — Ambulatory Visit
Admission: RE | Admit: 2023-02-24 | Discharge: 2023-02-24 | Disposition: A | Discharge status: Home / Self Care | Payer: Commercial Managed Care - PPO | Source: Ambulatory Visit | Attending: Nurse Practitioner | Admitting: Nurse Practitioner

## 2023-02-24 DIAGNOSIS — Z1231 Encounter for screening mammogram for malignant neoplasm of breast: Secondary | ICD-10-CM

## 2023-02-27 ENCOUNTER — Other Ambulatory Visit: Payer: Self-pay | Admitting: Nurse Practitioner

## 2023-02-27 DIAGNOSIS — R928 Other abnormal and inconclusive findings on diagnostic imaging of breast: Secondary | ICD-10-CM

## 2023-03-05 ENCOUNTER — Other Ambulatory Visit: Payer: Self-pay | Admitting: Nurse Practitioner

## 2023-03-05 ENCOUNTER — Ambulatory Visit
Admission: RE | Admit: 2023-03-05 | Discharge: 2023-03-05 | Disposition: A | Discharge status: Home / Self Care | Payer: Commercial Managed Care - PPO | Source: Ambulatory Visit | Attending: Nurse Practitioner | Admitting: Nurse Practitioner

## 2023-03-05 DIAGNOSIS — R928 Other abnormal and inconclusive findings on diagnostic imaging of breast: Secondary | ICD-10-CM

## 2023-03-05 DIAGNOSIS — R921 Mammographic calcification found on diagnostic imaging of breast: Secondary | ICD-10-CM

## 2023-03-12 ENCOUNTER — Ambulatory Visit
Admission: RE | Admit: 2023-03-12 | Discharge: 2023-03-12 | Disposition: A | Discharge status: Home / Self Care | Payer: Commercial Managed Care - PPO | Source: Ambulatory Visit | Attending: Nurse Practitioner | Admitting: Nurse Practitioner

## 2023-03-12 DIAGNOSIS — R921 Mammographic calcification found on diagnostic imaging of breast: Secondary | ICD-10-CM

## 2023-03-12 DIAGNOSIS — R928 Other abnormal and inconclusive findings on diagnostic imaging of breast: Secondary | ICD-10-CM

## 2023-03-12 HISTORY — PX: BREAST BIOPSY: SHX20

## 2024-03-11 ENCOUNTER — Other Ambulatory Visit: Payer: Self-pay | Admitting: Nurse Practitioner

## 2024-03-11 DIAGNOSIS — Z1231 Encounter for screening mammogram for malignant neoplasm of breast: Secondary | ICD-10-CM

## 2024-03-15 ENCOUNTER — Ambulatory Visit
Admission: RE | Admit: 2024-03-15 | Discharge: 2024-03-15 | Disposition: A | Discharge status: Home / Self Care | Source: Ambulatory Visit | Attending: Internal Medicine | Admitting: Internal Medicine

## 2024-03-15 DIAGNOSIS — Z1231 Encounter for screening mammogram for malignant neoplasm of breast: Secondary | ICD-10-CM
